# Patient Record
Sex: Female | Born: 2000 | Race: White | Hispanic: No | Marital: Single | Smoking: Never smoker
Health system: Southern US, Community
[De-identification: ages and names within clinical notes are randomized; demographics above are authoritative.]

## PROBLEM LIST (undated history)

## (undated) DIAGNOSIS — J45909 Unspecified asthma, uncomplicated: Secondary | ICD-10-CM

## (undated) DIAGNOSIS — T7840XA Allergy, unspecified, initial encounter: Secondary | ICD-10-CM

## (undated) HISTORY — DX: Unspecified asthma, uncomplicated: J45.909

## (undated) HISTORY — DX: Allergy, unspecified, initial encounter: T78.40XA

---
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
## 2020-02-26 DIAGNOSIS — Z20822 Contact with and (suspected) exposure to covid-19: Secondary | ICD-10-CM | POA: Diagnosis not present

## 2020-06-06 DIAGNOSIS — Z13220 Encounter for screening for lipoid disorders: Secondary | ICD-10-CM | POA: Diagnosis not present

## 2020-06-06 DIAGNOSIS — D234 Other benign neoplasm of skin of scalp and neck: Secondary | ICD-10-CM | POA: Diagnosis not present

## 2020-06-06 DIAGNOSIS — F840 Autistic disorder: Secondary | ICD-10-CM | POA: Diagnosis not present

## 2020-06-06 DIAGNOSIS — Z113 Encounter for screening for infections with a predominantly sexual mode of transmission: Secondary | ICD-10-CM | POA: Diagnosis not present

## 2020-06-06 DIAGNOSIS — J4530 Mild persistent asthma, uncomplicated: Secondary | ICD-10-CM | POA: Diagnosis not present

## 2020-06-06 DIAGNOSIS — Z0000 Encounter for general adult medical examination without abnormal findings: Secondary | ICD-10-CM | POA: Diagnosis not present

## 2020-06-29 DIAGNOSIS — Z91010 Allergy to peanuts: Secondary | ICD-10-CM | POA: Diagnosis not present

## 2020-06-29 DIAGNOSIS — J301 Allergic rhinitis due to pollen: Secondary | ICD-10-CM | POA: Diagnosis not present

## 2020-06-29 DIAGNOSIS — J3081 Allergic rhinitis due to animal (cat) (dog) hair and dander: Secondary | ICD-10-CM | POA: Diagnosis not present

## 2020-06-29 DIAGNOSIS — Z91018 Allergy to other foods: Secondary | ICD-10-CM | POA: Diagnosis not present

## 2020-08-31 ENCOUNTER — Other Ambulatory Visit: Payer: Self-pay

## 2020-08-31 ENCOUNTER — Ambulatory Visit (INDEPENDENT_AMBULATORY_CARE_PROVIDER_SITE_OTHER): Payer: BC Managed Care – PPO | Admitting: Podiatry

## 2020-08-31 DIAGNOSIS — L600 Ingrowing nail: Secondary | ICD-10-CM

## 2020-08-31 MED ORDER — NEOMYCIN-POLYMYXIN-HC 3.5-10000-1 OT SOLN: OTIC | 1 refills | Status: DC

## 2020-08-31 NOTE — Progress Notes (Signed)
Subjective:   Patient ID: Tanya Wagner, female   DOB: 20 y.o.   MRN: [id number]   HPI Patient presents with her mother with chronic ingrown toenail deformity right hallux that is been going on and getting worse over the last few months.  States they tried to soak it and its not working and she has had nail disease in the past which has not changed   ROS      Objective:  Physical Exam  Neurovascular status intact with incurvated lateral border right hallux moderately painful when pressed slight distal redness no active drainage noted and fissures in her nails which have been normal for her big toenails for the last 3 to 4 years     Assessment:  Chronic ingrown toenail deformity right hallux lateral border painful along with slight fissuring of her nailbeds     Plan:  H&P reviewed condition recommended correction of the corner due to pain and deformity and patient and mother want the procedure.  I left him to read and then signed consent form understanding risk and I infiltrated the right hallux 60 mg like Marcaine mixture sterile prepped using sterile instrumentation remove lateral border exposed matrix applied phenol 3 applications 30 seconds followed by alcohol lavage sterile dressing gave instructions on soaks and reappoint and encouraged them to call any questions concerns which may arise

## 2020-08-31 NOTE — Patient Instructions (Signed)

## 2020-09-08 DIAGNOSIS — L7211 Pilar cyst: Secondary | ICD-10-CM | POA: Diagnosis not present

## 2020-09-12 DIAGNOSIS — Z309 Encounter for contraceptive management, unspecified: Secondary | ICD-10-CM | POA: Diagnosis not present

## 2020-09-12 DIAGNOSIS — Z6821 Body mass index (BMI) 21.0-21.9, adult: Secondary | ICD-10-CM | POA: Diagnosis not present

## 2020-09-12 DIAGNOSIS — Z01419 Encounter for gynecological examination (general) (routine) without abnormal findings: Secondary | ICD-10-CM | POA: Diagnosis not present

## 2022-03-22 DIAGNOSIS — Z01419 Encounter for gynecological examination (general) (routine) without abnormal findings: Secondary | ICD-10-CM | POA: Diagnosis not present

## 2022-03-22 DIAGNOSIS — Z124 Encounter for screening for malignant neoplasm of cervix: Secondary | ICD-10-CM | POA: Diagnosis not present

## 2022-03-22 DIAGNOSIS — Z6823 Body mass index (BMI) 23.0-23.9, adult: Secondary | ICD-10-CM | POA: Diagnosis not present

## 2022-03-23 LAB — HM PAP SMEAR

## 2022-04-12 DIAGNOSIS — J45998 Other asthma: Secondary | ICD-10-CM | POA: Diagnosis not present

## 2022-09-27 DIAGNOSIS — J4530 Mild persistent asthma, uncomplicated: Secondary | ICD-10-CM | POA: Diagnosis not present

## 2022-09-27 DIAGNOSIS — Z91010 Allergy to peanuts: Secondary | ICD-10-CM | POA: Diagnosis not present

## 2022-09-27 DIAGNOSIS — J301 Allergic rhinitis due to pollen: Secondary | ICD-10-CM | POA: Diagnosis not present

## 2022-09-27 DIAGNOSIS — J3081 Allergic rhinitis due to animal (cat) (dog) hair and dander: Secondary | ICD-10-CM | POA: Diagnosis not present

## 2022-12-11 ENCOUNTER — Ambulatory Visit: Payer: BC Managed Care – PPO | Admitting: Physician Assistant

## 2022-12-11 ENCOUNTER — Encounter: Payer: Self-pay | Admitting: Physician Assistant

## 2022-12-11 VITALS — BP 94/60 | HR 84 | Temp 97.7°F | Ht 64.0 in | Wt 132.0 lb

## 2022-12-11 DIAGNOSIS — F411 Generalized anxiety disorder: Secondary | ICD-10-CM

## 2022-12-11 DIAGNOSIS — Z23 Encounter for immunization: Secondary | ICD-10-CM

## 2022-12-11 DIAGNOSIS — Z0001 Encounter for general adult medical examination with abnormal findings: Secondary | ICD-10-CM

## 2022-12-11 DIAGNOSIS — J4530 Mild persistent asthma, uncomplicated: Secondary | ICD-10-CM

## 2022-12-11 DIAGNOSIS — Z0000 Encounter for general adult medical examination without abnormal findings: Secondary | ICD-10-CM

## 2022-12-11 NOTE — Patient Instructions (Addendum)
It was great to see you!  Please consider seeing Dermatology for your skin checks  Please call and schedule labs if and when you are ready.  Take care,  Lelon Mast

## 2022-12-11 NOTE — Progress Notes (Signed)
Tanya Wagner is a 22 y.o. female and is here for a comprehensive physical exam.  HPI - here w/ mother.  Health Maintenance Due  Topic Date Due   HIV Screening  Never done   Hepatitis C Screening  Never done   DTaP/Tdap/Td (7 - Td or Tdap) 07/22/2022   INFLUENZA VACCINE  09/06/2022   Acute Concerns: None.  Chronic Issues: Asthma/Allergies: Asthma managed with 10 mg Singulair nightly, Flovent HFA BID, and Albuterol as needed. Well controlled.  Allergies managed with 10 mg Zyrtec daily, Flonase daily, and an EPI-pen as needed.   Menorrhagia: Managed with YAZ contraception. Endorses regular periods.  Notes she does occasionally have headaches; denies aura.  Dermatology: Mother states pt does see dermatology for raised bumps on scalp Fhx (paternal) of skin cancers noted.   Health Maintenance: Immunizations -- UpToDate Colonoscopy-- N/A Mammogram-- N/A PAP-- Has done; will request records. Bone Density-- N/A Diet -- Healthy overall and well hydrated. Also drinks tea and occasionally a soda. Exercise --  Regular exercise: cardio, weights, and walking dog regularly  Sleep habits -- Good sleep quality Mood -- Endorses Anxiety (pt states is controlled, not severe enough for medication management, & not interested in therapy), otherwise stable  UTD with dentist? - Yes UTD with eye doctor? - No, has not needed to  Weight history: Wt Readings from Last 10 Encounters:  12/11/22 132 lb (59.9 kg)   Body mass index is 22.66 kg/m. Patient's last menstrual period was 12/08/2022 (exact date).  Alcohol use:  reports no history of alcohol use.  Tobacco use:  Tobacco Use: Low Risk  (12/11/2022)   Patient History    Smoking Tobacco Use: Never    Smokeless Tobacco Use: Never    Passive Exposure: Not on file   Eligible for lung cancer screening? no     12/11/2022    9:26 AM  Depression screen PHQ 2/9  Decreased Interest 0  Down, Depressed, Hopeless 0  PHQ - 2 Score 0   Altered sleeping 1  Tired, decreased energy 0  Change in appetite 0  Feeling bad or failure about yourself  1  Trouble concentrating 0  Moving slowly or fidgety/restless 0  Suicidal thoughts 0  PHQ-9 Score 2  Difficult doing work/chores Somewhat difficult     Other providers/specialists: Patient Care Team: Jarold Motto, [place] as PCP - General (Physician Assistant) Marisue Brooklyn, DO (Internal Medicine) Ginette Otto, [facility] as Consulting Physician (Obstetrics and Gynecology)   PMHx, SurgHx, SocialHx, Medications, and Allergies were reviewed in the Visit Navigator and updated as appropriate.   Past Medical History:  Diagnosis Date   Allergy    Asthma    Autism disorder 2014    Past Surgical History:  Procedure Laterality Date   TYMPANOSTOMY TUBE PLACEMENT Bilateral 2004   WISDOM TOOTH EXTRACTION Bilateral 2019   Family History  Problem Relation Age of Onset   Learning disabilities Mother    Depression Mother    Arthritis Mother    Alcohol abuse Mother        Has been sober for 4 years   Hyperlipidemia Father    Skin cancer Father    Healthy Sister    Asthma Brother    Breast cancer Maternal Grandmother 61   Hyperlipidemia Maternal Grandmother    Arthritis Maternal Grandmother    Skin cancer Paternal Grandmother    Skin cancer Paternal Grandfather    Colon cancer Neg Hx    Social History   Tobacco Use  Smoking status: Never   Smokeless tobacco: Never  Vaping Use   Vaping status: Never Used  Substance Use Topics   Alcohol use: Never   Drug use: Never   Review of Systems:   Review of Systems  Constitutional:  Negative for chills, fever, malaise/fatigue and weight loss.  HENT:  Negative for hearing loss, sinus pain and sore throat.   Respiratory:  Negative for cough and hemoptysis.   Cardiovascular:  Negative for chest pain, palpitations, leg swelling and PND.  Gastrointestinal:  Negative for abdominal pain, constipation, diarrhea,  heartburn, nausea and vomiting.  Genitourinary:  Negative for dysuria, frequency and urgency.  Musculoskeletal:  Negative for back pain, myalgias and neck pain.  Skin:  Negative for itching and rash.  Neurological:  Negative for dizziness, tingling, seizures and headaches.  Endo/Heme/Allergies:  Negative for polydipsia.  Psychiatric/Behavioral:  Negative for depression. The patient is not nervous/anxious.      Objective:   BP 94/60 (BP Location: Left Arm, Patient Position: Sitting, Cuff Size: Normal)   Pulse 84   Temp 97.7 F (36.5 C) (Temporal)   Ht 5\' 4"  (1.626 m)   Wt 132 lb (59.9 kg)   LMP 12/08/2022 (Exact Date)   SpO2 99%   BMI 22.66 kg/m  Body mass index is 22.66 kg/m.   General Appearance:    Alert, cooperative, no distress, appears stated age  Head:    Normocephalic, without obvious abnormality, atraumatic  Eyes:    PERRL, conjunctiva/corneas clear, EOM's intact, fundi    benign, both eyes  Ears:    Normal TM's and external ear canals, both ears  Nose:   Nares normal, septum midline, mucosa normal, no drainage    or sinus tenderness  Throat:   Lips, mucosa, and tongue normal; teeth and gums normal  Neck:   Supple, symmetrical, trachea midline, no adenopathy;    thyroid:  no enlargement/tenderness/nodules; no carotid   bruit or JVD  Back:     Symmetric, no curvature, ROM normal, no CVA tenderness  Lungs:     Clear to auscultation bilaterally, respirations unlabored  Chest Wall:    No tenderness or deformity   Heart:    Regular rate and rhythm, S1 and S2 normal, no murmur, rub or gallop  Breast Exam:    Deferred  Abdomen:     Soft, non-tender, bowel sounds active all four quadrants,    no masses, no organomegaly  Genitalia:    Deferred  Extremities:   Extremities normal, atraumatic, no cyanosis or edema  Pulses:   2+ and symmetric all extremities  Skin:   Skin color, texture, turgor normal, no rashes or lesions  Lymph nodes:   Cervical, supraclavicular, and  axillary nodes normal  Neurologic:   CNII-XII intact, normal strength, sensation and reflexes    throughout    Assessment/Plan:   Routine physical examination Today patient counseled on age appropriate routine health concerns for screening and prevention, each reviewed and up to date or declined. Immunizations reviewed and up to date or declined. Risk factors for depression reviewed and negative. Hearing function and visual acuity are intact. ADLs screened and addressed as needed. Functional ability and level of safety reviewed and appropriate. Education, counseling and referrals performed based on assessed risks today. Patient provided with a copy of personalized plan for preventive services.  Labs deferred -- she will either schedule future lab visit or will we defer to next year.  Mild persistent asthma without complication Well controlled Management per asthma/allergy - notes  reviewed  GAD (generalized anxiety disorder) Uncontrolled Declines intervention Continue to monitor Follow-up as needed Denies suicidal ideation/hi   Need for immunization against influenza UTD  Need for prophylactic vaccination with combined diphtheria-tetanus-pertussis (DTP) vaccine UTD  I,Emily Lagle,acting as a scribe for [employer], PA.,have documented all relevant documentation on the behalf of Jarold Motto, PA,as directed by  Jarold Motto, PA while in the presence of Jarold Motto, [place].  I, Jarold Motto, [place], have reviewed all documentation for this visit. The documentation on 12/11/22 for the exam, diagnosis, procedures, and orders are all accurate and complete.  Jarold Motto, PA-C [facility]

## 2023-01-23 ENCOUNTER — Ambulatory Visit: Payer: Self-pay | Admitting: Physician Assistant

## 2023-04-19 DIAGNOSIS — Z6822 Body mass index (BMI) 22.0-22.9, adult: Secondary | ICD-10-CM | POA: Diagnosis not present

## 2023-04-19 DIAGNOSIS — Z01419 Encounter for gynecological examination (general) (routine) without abnormal findings: Secondary | ICD-10-CM | POA: Diagnosis not present

## 2023-05-28 DIAGNOSIS — H66011 Acute suppurative otitis media with spontaneous rupture of ear drum, right ear: Secondary | ICD-10-CM | POA: Diagnosis not present

## 2023-05-28 DIAGNOSIS — R051 Acute cough: Secondary | ICD-10-CM | POA: Diagnosis not present

## 2023-06-06 DIAGNOSIS — L7211 Pilar cyst: Secondary | ICD-10-CM | POA: Diagnosis not present

## 2023-06-06 DIAGNOSIS — L578 Other skin changes due to chronic exposure to nonionizing radiation: Secondary | ICD-10-CM | POA: Diagnosis not present

## 2023-06-06 DIAGNOSIS — L814 Other melanin hyperpigmentation: Secondary | ICD-10-CM | POA: Diagnosis not present

## 2023-06-06 DIAGNOSIS — L720 Epidermal cyst: Secondary | ICD-10-CM | POA: Diagnosis not present

## 2023-06-11 ENCOUNTER — Encounter: Payer: Self-pay | Admitting: Physician Assistant

## 2023-06-11 ENCOUNTER — Ambulatory Visit: Admitting: Physician Assistant

## 2023-06-11 VITALS — BP 100/70 | HR 82 | Temp 98.1°F | Ht 64.0 in | Wt 130.2 lb

## 2023-06-11 DIAGNOSIS — H9201 Otalgia, right ear: Secondary | ICD-10-CM | POA: Diagnosis not present

## 2023-06-11 NOTE — Progress Notes (Signed)
 Tanya Wagner is a 23 y.o. female here for a follow up of a pre-existing problem.  History of Present Illness:   Chief Complaint  Patient presents with   UC follow up    Pt was seen at [facility] on 4/22 for right ear pain, was told ear drum ruptured. Pt states it is still popping off and on, left ear pressure off and on.    HPI - Pt is accompanied by her mother.   Ear pain / Urgent care follow up Pt presented to the UC on 05/28/23 complaining of right pain and a cough.  She reports being told her ear drum ruptured.  About 2 days prior to onset of ear pain she traveled by plane.  At the time she had yellow drainage and blood from her ear.  She was prescribed ear drops and oral antibiotics, which she was compliant with and tolerated well.  Today she reports her ear pain and cough have overall improved.  She is still experiencing occasional ear popping.  Since onset of ear pain, she has not been using her Air pods.  She does plug her ears when showering.  No upcoming air travel in the near future.  Her mother reports having tubes placed as a child due to recurrent ear infections, but did not previously have any issues since childhood.   Past Medical History:  Diagnosis Date   Allergy    Asthma    Autism disorder 2014     Social History   Tobacco Use   Smoking status: Never   Smokeless tobacco: Never  Vaping Use   Vaping status: Never Used  Substance Use Topics   Alcohol use: Never   Drug use: Never    Past Surgical History:  Procedure Laterality Date   TYMPANOSTOMY TUBE PLACEMENT Bilateral 2004   WISDOM TOOTH EXTRACTION Bilateral 2019    Family History  Problem Relation Age of Onset   Learning disabilities Mother    Depression Mother    Arthritis Mother    Alcohol abuse Mother        Has been sober for 4 years   Hyperlipidemia Father    Skin cancer Father    Healthy Sister    Asthma Brother    Breast cancer Maternal Grandmother 40   Hyperlipidemia Maternal  Grandmother    Arthritis Maternal Grandmother    Skin cancer Paternal Grandmother    Skin cancer Paternal Grandfather    Colon cancer Neg Hx     Allergies  Allergen Reactions   Peanut-Containing Drug Products Hives    Peanuts/tree nuts   Sulfa Antibiotics Rash    Current Medications:   Current Outpatient Medications:    albuterol (VENTOLIN HFA) 108 (90 Base) MCG/ACT inhaler, Inhale 2 puffs into the lungs every 4 (four) hours as needed., Disp: , Rfl:    cetirizine (ZYRTEC) 10 MG tablet, Take 10 mg by mouth daily., Disp: , Rfl:    drospirenone-ethinyl estradiol (YAZ) 3-0.02 MG tablet, Take 1 tablet by mouth daily., Disp: , Rfl:    EPINEPHrine 0.3 mg/0.3 mL IJ SOAJ injection, Inject 0.3 mg into the muscle as needed for anaphylaxis., Disp: , Rfl:    FLOVENT HFA 44 MCG/ACT inhaler, SMARTSIG:2 Puff(s) By Mouth Twice Daily, Disp: , Rfl:    fluticasone (FLONASE) 50 MCG/ACT nasal spray, Place 2 sprays into both nostrils daily., Disp: , Rfl:    montelukast (SINGULAIR) 10 MG tablet, TK 1 T PO QD IN THE EVE, Disp: , Rfl: 5  triamcinolone ointment (KENALOG) 0.1 %, Apply 1 Application topically 2 (two) times daily., Disp: , Rfl:    tretinoin (RETIN-A) 0.025 % cream, Apply 1 Application topically at bedtime. (Patient not taking: Reported on 06/11/2023), Disp: , Rfl:    Review of Systems:   Negative unless otherwise specified per HPI.  Vitals:   Vitals:   06/11/23 0944  BP: 100/70  Pulse: 82  Temp: 98.1 F (36.7 C)  TempSrc: Temporal  SpO2: 99%  Weight: 130 lb 4 oz (59.1 kg)  Height: 5\' 4"  (1.626 m)     Body mass index is 22.36 kg/m.  Physical Exam:   Physical Exam Vitals and nursing note reviewed.  Constitutional:      General: She is not in acute distress.    Appearance: She is well-developed. She is not ill-appearing or toxic-appearing.  HENT:     Head: Normocephalic and atraumatic.     Right Ear: Ear canal and external ear normal. A middle ear effusion is present. Tympanic  membrane is not erythematous, retracted or bulging.     Left Ear: Ear canal and external ear normal. A middle ear effusion is present. Tympanic membrane is not erythematous, retracted or bulging.     Nose: Nose normal.     Right Sinus: No maxillary sinus tenderness or frontal sinus tenderness.     Left Sinus: No maxillary sinus tenderness or frontal sinus tenderness.     Mouth/Throat:     Pharynx: Uvula midline. No posterior oropharyngeal erythema.  Eyes:     General: Lids are normal.     Conjunctiva/sclera: Conjunctivae normal.  Neck:     Trachea: Trachea normal.  Cardiovascular:     Rate and Rhythm: Normal rate and regular rhythm.     Pulses: Normal pulses.     Heart sounds: Normal heart sounds, S1 normal and S2 normal.  Pulmonary:     Effort: Pulmonary effort is normal.     Breath sounds: Normal breath sounds. No decreased breath sounds, wheezing, rhonchi or rales.  Lymphadenopathy:     Cervical: No cervical adenopathy.  Skin:    General: Skin is warm and dry.  Neurological:     Mental Status: She is alert.     GCS: GCS eye subscore is 4. GCS verbal subscore is 5. GCS motor subscore is 6.  Psychiatric:        Speech: Speech normal.        Behavior: Behavior normal. Behavior is cooperative.     Assessment and Plan:   1. Right ear pain (Primary) I do not see tympanic membrane perforation on my exam Continue Flonase, Zyrtec, Singulair Symptom(s) resolve If new/worsening symptom(s), recommend reach out and we will refer to ENT    I, Bernita Bristle, acting as a scribe for Alexander Iba, [place]., have documented all relevant documentation on the behalf of Alexander Iba, [place], as directed by   while in the presence of Alexander Iba, [place].  I, Alexander Iba, [place], have reviewed all documentation for this visit. The documentation on 06/11/23 for the exam, diagnosis, procedures, and orders are all accurate and complete.  Alexander Iba, PA-C

## 2023-09-25 DIAGNOSIS — Z91010 Allergy to peanuts: Secondary | ICD-10-CM | POA: Diagnosis not present

## 2023-09-25 DIAGNOSIS — J3081 Allergic rhinitis due to animal (cat) (dog) hair and dander: Secondary | ICD-10-CM | POA: Diagnosis not present

## 2023-09-25 DIAGNOSIS — Z91018 Allergy to other foods: Secondary | ICD-10-CM | POA: Diagnosis not present

## 2023-09-25 DIAGNOSIS — J301 Allergic rhinitis due to pollen: Secondary | ICD-10-CM | POA: Diagnosis not present

## 2023-09-25 DIAGNOSIS — J4530 Mild persistent asthma, uncomplicated: Secondary | ICD-10-CM | POA: Diagnosis not present

## 2023-10-23 DIAGNOSIS — L858 Other specified epidermal thickening: Secondary | ICD-10-CM | POA: Diagnosis not present

## 2023-10-23 DIAGNOSIS — L910 Hypertrophic scar: Secondary | ICD-10-CM | POA: Diagnosis not present

## 2023-10-23 DIAGNOSIS — L720 Epidermal cyst: Secondary | ICD-10-CM | POA: Diagnosis not present

## 2023-11-26 DIAGNOSIS — L309 Dermatitis, unspecified: Secondary | ICD-10-CM | POA: Diagnosis not present
